# Patient Record
Sex: Male | Born: 1990 | Race: Black or African American | Hispanic: No | Marital: Single | Smoking: Never smoker
Health system: Southern US, Community
[De-identification: ages and names within clinical notes are randomized; demographics above are authoritative.]

---
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
## 2014-07-23 HISTORY — PX: SHOULDER SURGERY: SHX246

## 2016-09-25 ENCOUNTER — Ambulatory Visit (INDEPENDENT_AMBULATORY_CARE_PROVIDER_SITE_OTHER): Admitting: Family Medicine

## 2016-09-25 ENCOUNTER — Encounter: Payer: Self-pay | Admitting: Family Medicine

## 2016-09-25 DIAGNOSIS — M629 Disorder of muscle, unspecified: Secondary | ICD-10-CM

## 2016-09-25 DIAGNOSIS — F419 Anxiety disorder, unspecified: Secondary | ICD-10-CM | POA: Diagnosis not present

## 2016-09-25 NOTE — Assessment & Plan Note (Signed)
Suspect symptoms are related to tightness and stiffness of the muscles in his legs. Suspect this is related to him sitting for prolonged periods of time while at school. Discussed stretches for his hamstrings and his hip flexors. He'll continue to monitor.

## 2016-09-25 NOTE — Progress Notes (Signed)
[name], MD Phone: [phone]  Oscar Walker is a 26 y.o. male who presents today for a new patient visit.  Patient reports a fair amount of stress with school. He is currently a Holiday representative at [employer]. He notes during busy weeks with tests and working on projects he will feel very stressed and anxious. He will occasionally get a left-sided chest discomfort with this when he is studying or working on his projects. Only occurs when he is sitting and working on these things. No exertional component. No shortness of breath. Describes it as a throbbing discomfort. Lasts for about 20 minutes and then goes away on its own. No radiation. No diaphoresis. No history of hypertension, hyperlipidemia, or diabetes. No family history of heart disease. He also notes bitemporal headaches with this. Occurs with stress. He notes no numbness, weakness, or vision changes. These things have been going on intermittently over the last 2 years. He has never had symptoms with exercise. He has started to see a counselor and the symptoms have improved somewhat with this.  Patient additionally notes that his hamstrings might be tight and they pop a little bit when he's been sitting for a long period of time. He does note injuries in the past to his hamstrings with strains. He notes no recent injuries. He does not do any stretches for his hamstrings or hips.  Active Ambulatory Problems    Diagnosis Date Noted  . Anxiety 09/25/2016  . Hamstring tightness of both lower extremities 09/25/2016   Resolved Ambulatory Problems    Diagnosis Date Noted  . No Resolved Ambulatory Problems   No Additional Past Medical History    History reviewed. No pertinent family history.  Social History   Social History  . Marital status: Single    Spouse name: N/A  . Number of children: N/A  . Years of education: N/A   Occupational History  . Not on file.   Social History Main Topics  . Smoking status: Never Smoker    . Smokeless tobacco: Never Used  . Alcohol use No  . Drug use: No  . Sexual activity: Not on file   Other Topics Concern  . Not on file   Social History Narrative   Patient is a Holiday representative at [employer]. He is studying exercise science and hopes to go to physical therapy school in the future.    ROS  General:  Negative for nexplained weight loss, fever Skin: Negative for new or changing mole, sore that won't heal HEENT: Negative for trouble hearing, trouble seeing, ringing in ears, mouth sores, hoarseness, change in voice, dysphagia. CV:  Positive for chest pain, negative for  dyspnea, edema, palpitations Resp: Negative for cough, dyspnea, hemoptysis GI: Negative for nausea, vomiting, diarrhea, constipation, abdominal pain, melena, hematochezia. GU: Negative for dysuria, incontinence, urinary hesitance, hematuria, vaginal or penile discharge, polyuria, sexual difficulty, lumps in testicle or breasts MSK: Negative for muscle cramps or aches, joint pain or swelling Neuro: Positive for headaches, negative for  weakness, numbness, dizziness, passing out/fainting Psych: Negative for depression, anxiety, memory problems  Objective  Physical Exam Vitals:   09/25/16 0910  BP: 100/80  Pulse: 77  Temp: 98.6 F (37 C)    BP Readings from Last 3 Encounters:  09/25/16 100/80   Wt Readings from Last 3 Encounters:  09/25/16 194 lb 9.6 oz (88.3 kg)    Physical Exam  Constitutional: No distress.  HENT:  Head: Normocephalic and atraumatic.  Mouth/Throat: Oropharynx is clear  and moist.  Eyes: Conjunctivae are normal. Pupils are equal, round, and reactive to light.  Neck: Neck supple.  Cardiovascular: Normal rate, regular rhythm and normal heart sounds.   Pulmonary/Chest: Effort normal and breath sounds normal.  Abdominal: Soft. Bowel sounds are normal. He exhibits no distension. There is no tenderness. There is no rebound and no guarding.  Musculoskeletal: He  exhibits no edema.  Bilateral hamstrings nontender, full range of motion bilateral hips, full range of motion bilateral knees  Lymphadenopathy:    He has no cervical adenopathy.  Neurological: He is alert. Gait normal.  CN 2-12 intact, 5/5 strength in bilateral biceps, triceps, grip, quads, hamstrings, plantar and dorsiflexion, sensation to light touch intact in bilateral UE and LE  Skin: Skin is warm and dry. He is not diaphoretic.  Psychiatric:  Mood and affect normal at this time     Assessment/Plan:   Anxiety I suspect the patient's symptoms of chest pain and headaches are related to anxiety. Chest pain is very atypical and only occurs at rest when he is studying or working on a project. Headaches only occur at the same time and seem to be tension headaches. He has no symptoms when he has no anxiety. He is young and has no risk factors for cardiac disease. His vital signs are stable making VTE unlikely. No neurological deficits. No depression. No exercise issues. Discussed options for treatment of anxiety including SSRIs and potential risks of these versus counseling. He has been seeing a Veterinary surgeoncounselor and opted to continue to see them as this has been beneficial. He will continue to monitor. If he has worsening of symptoms he'll let us know. He is given return precautions.  Hamstring tightness of both lower extremities Suspect symptoms are related to tightness and stiffness of the muscles in his legs. Suspect this is related to him sitting for prolonged periods of time while at school. Discussed stretches for his hamstrings and his hip flexors. He'll continue to monitor.   No orders of the defined types were placed in this encounter.   No orders of the defined types were placed in this encounter.    Marikay AlarEric Sonnenberg, MD [facility] [place]

## 2016-09-25 NOTE — Progress Notes (Signed)
Pre visit review using our clinic review tool, if applicable. No additional management support is needed unless otherwise documented below in the visit note. 

## 2016-09-25 NOTE — Patient Instructions (Signed)
Nice to meet you. I suspect most of your symptoms are related to stress and anxiety. Please continue to see the counselor at school. If you would like to start on a medication for this in the future please let us know. Please do the stretches we discussed for your hamstrings and hips. If you develop change in your chest discomfort, shortness of breath, radiation, sweating, worsening or change in your headaches, numbness, weakness, vision changes, worsening of anxiety, or depression, or any new or changing symptoms please seek medical attention medially.

## 2016-09-25 NOTE — Assessment & Plan Note (Signed)
I suspect the patient's symptoms of chest pain and headaches are related to anxiety. Chest pain is very atypical and only occurs at rest when he is studying or working on a project. Headaches only occur at the same time and seem to be tension headaches. He has no symptoms when he has no anxiety. He is young and has no risk factors for cardiac disease. His vital signs are stable making VTE unlikely. No neurological deficits. No depression. No exercise issues. Discussed options for treatment of anxiety including SSRIs and potential risks of these versus counseling. He has been seeing a Veterinary surgeoncounselor and opted to continue to see them as this has been beneficial. He will continue to monitor. If he has worsening of symptoms he'll let us know. He is given return precautions.

## 2016-12-27 ENCOUNTER — Ambulatory Visit: Admitting: Family Medicine

## 2017-01-30 ENCOUNTER — Ambulatory Visit (INDEPENDENT_AMBULATORY_CARE_PROVIDER_SITE_OTHER): Admitting: Family Medicine

## 2017-01-30 ENCOUNTER — Encounter: Payer: Self-pay | Admitting: Family Medicine

## 2017-01-30 DIAGNOSIS — F419 Anxiety disorder, unspecified: Secondary | ICD-10-CM

## 2017-01-30 NOTE — Assessment & Plan Note (Signed)
Improved quite a bit. No more headaches. Occasional chest pain now that he is awaiting a decision from [place] on the postbaccalaureate program. No exertional symptoms. Unlikely cardiac, VTE, or GI related. Could be musculoskeletal though I do believe it's related to anxiety. He would like to try talking to his pastor to see if that would be beneficial. Offered referral to a therapist or medication though he declined currently. He will contact us if he would like to consider either of these. Given return precautions.

## 2017-01-30 NOTE — Progress Notes (Signed)
  Marikay AlarEric Dannon Nguyenthi, MD Phone: [phone]  Oscar PaddyJoshua M Brostrom is a 26 y.o. male who presents today for follow-up.  Anxiety: Patient notes this did get quite a bit better. Seeing a counselor was helpful. Also finishing school helped. Does still note occasional chest pain that only occurs when he has downtime as time to think of things. Has only returned since he is anxious to find out if he got into a postbaccalaureate program. Notes it is aching and he can pinpoint the area that it occurs. He has it at no other times. No pain with working out. No shortness of breath or radiation. Occasionally his chest will pop if he stretches. This does help with the discomfort. He's not had any headaches.   ROS see history of present illness  Objective  Physical Exam Vitals:   01/30/17 1406  BP: 112/70  Pulse: 73  Temp: 99.5 F (37.5 C)    BP Readings from Last 3 Encounters:  01/30/17 112/70  09/25/16 100/80   Wt Readings from Last 3 Encounters:  01/30/17 200 lb 6 oz (90.9 kg)  09/25/16 194 lb 9.6 oz (88.3 kg)    Physical Exam  Constitutional: No distress.  Cardiovascular: Normal rate, regular rhythm and normal heart sounds.   Pulmonary/Chest: Effort normal and breath sounds normal. He exhibits no tenderness.  Musculoskeletal: He exhibits no edema.  Neurological: He is alert. Gait normal.  Skin: He is not diaphoretic.     Assessment/Plan: Please see individual problem list.  Anxiety Improved quite a bit. No more headaches. Occasional chest pain now that he is awaiting a decision from [place] on the postbaccalaureate program. No exertional symptoms. Unlikely cardiac, VTE, or GI related. Could be musculoskeletal though I do believe it's related to anxiety. He would like to try talking to his pastor to see if that would be beneficial. Offered referral to a therapist or medication though he declined currently. He will contact us if he would like to consider either of these. Given return  precautions.  Marikay AlarEric Jacai Kipp, MD [facility] [place]

## 2017-01-30 NOTE — Patient Instructions (Signed)
Nice to see you. Please continue to monitor your anxiety. Please try to talk to your pastor. If you would like a referral to a counselor or therapist please let us know. If you're chest pain changes or you develop any new symptoms with the please be reevaluated.

## 2019-02-06 ENCOUNTER — Other Ambulatory Visit: Payer: Self-pay

## 2019-02-11 ENCOUNTER — Telehealth: Payer: Self-pay | Admitting: Family Medicine

## 2019-02-11 NOTE — Telephone Encounter (Signed)
[place] requiring patient to have QuantiFERON-TB Gold Plus orders please fax to [facility]  [address] [place], [place] [address], [phone]. Patient would like a follow up when orders are faxed

## 2019-02-12 NOTE — Telephone Encounter (Signed)
It looks like he is coming in for a physical next week. We can obtain this test at that time if he is willing to wait until then, otherwise I can place orders for that and his other labs. Please find out if he has ever had a positive PPD. Thanks.

## 2019-02-12 NOTE — Telephone Encounter (Signed)
Lmtcb.  Tiphanie Vo,cma 

## 2019-02-13 NOTE — Telephone Encounter (Signed)
Noted  

## 2019-02-13 NOTE — Telephone Encounter (Signed)
Patient returned my call and stated he can wait until his physical to do the test and other lab work. Patient states he has never tested positive this test is for school.  Reverie Vaquera,cma

## 2019-02-17 ENCOUNTER — Ambulatory Visit (INDEPENDENT_AMBULATORY_CARE_PROVIDER_SITE_OTHER): Payer: No Typology Code available for payment source | Admitting: Family Medicine

## 2019-02-17 ENCOUNTER — Other Ambulatory Visit: Payer: Self-pay

## 2019-02-17 ENCOUNTER — Encounter (INDEPENDENT_AMBULATORY_CARE_PROVIDER_SITE_OTHER): Payer: Self-pay

## 2019-02-17 ENCOUNTER — Encounter: Payer: Self-pay | Admitting: Family Medicine

## 2019-02-17 VITALS — BP 118/80 | HR 57 | Temp 97.8°F | Ht 66.5 in | Wt 198.4 lb

## 2019-02-17 DIAGNOSIS — Z0000 Encounter for general adult medical examination without abnormal findings: Secondary | ICD-10-CM | POA: Diagnosis not present

## 2019-02-17 DIAGNOSIS — Z111 Encounter for screening for respiratory tuberculosis: Secondary | ICD-10-CM

## 2019-02-17 DIAGNOSIS — Z13220 Encounter for screening for lipoid disorders: Secondary | ICD-10-CM | POA: Diagnosis not present

## 2019-02-17 DIAGNOSIS — Z1329 Encounter for screening for other suspected endocrine disorder: Secondary | ICD-10-CM | POA: Diagnosis not present

## 2019-02-17 DIAGNOSIS — E669 Obesity, unspecified: Secondary | ICD-10-CM | POA: Diagnosis not present

## 2019-02-17 DIAGNOSIS — Z130 Encounter for screening for diseases of the blood and blood-forming organs and certain disorders involving the immune mechanism: Secondary | ICD-10-CM

## 2019-02-17 DIAGNOSIS — E66811 Obesity, class 1: Secondary | ICD-10-CM

## 2019-02-17 DIAGNOSIS — Z789 Other specified health status: Secondary | ICD-10-CM

## 2019-02-17 LAB — LIPID PANEL
Cholesterol: 231 mg/dL — ABNORMAL HIGH (ref 0–200)
HDL: 39.2 mg/dL (ref >39.00)
LDL Cholesterol: 165 mg/dL — ABNORMAL HIGH (ref 0–99)
NonHDL: 192.08
Total CHOL/HDL Ratio: 6
Triglycerides: 136 mg/dL (ref 0.0–149.0)
VLDL: 27.2 mg/dL (ref 0.0–40.0)

## 2019-02-17 LAB — COMPREHENSIVE METABOLIC PANEL
ALT: 15 U/L (ref 0–53)
AST: 16 U/L (ref 0–37)
Albumin: 5.1 g/dL (ref 3.5–5.2)
Alkaline Phosphatase: 39 U/L (ref 39–117)
BUN: 14 mg/dL (ref 6–23)
CO2: 25 mEq/L (ref 19–32)
Calcium: 9.7 mg/dL (ref 8.4–10.5)
Chloride: 104 mEq/L (ref 96–112)
Creatinine, Ser: 1.12 mg/dL (ref 0.40–1.50)
GFR: 95.97 mL/min (ref >60.00)
Glucose, Bld: 87 mg/dL (ref 70–99)
Potassium: 3.8 mEq/L (ref 3.5–5.1)
Sodium: 138 mEq/L (ref 135–145)
Total Bilirubin: 0.4 mg/dL (ref 0.2–1.2)
Total Protein: 7.7 g/dL (ref 6.0–8.3)

## 2019-02-17 LAB — CBC
HCT: 41.6 % (ref 39.0–52.0)
Hemoglobin: 14 g/dL (ref 13.0–17.0)
MCHC: 33.7 g/dL (ref 30.0–36.0)
MCV: 87.2 fl (ref 78.0–100.0)
Platelets: 278 10*3/uL (ref 150.0–400.0)
RBC: 4.77 Mil/uL (ref 4.22–5.81)
RDW: 13.5 % (ref 11.5–15.5)
WBC: 4.9 10*3/uL (ref 4.0–10.5)

## 2019-02-17 LAB — HEMOGLOBIN A1C: Hgb A1c MFr Bld: 5.8 % (ref 4.6–6.5)

## 2019-02-17 LAB — TSH: TSH: 1.27 u[IU]/mL (ref 0.35–4.50)

## 2019-02-17 NOTE — Progress Notes (Signed)
Tommi Rumps, MD Phone: [phone]  Oscar Walker is a 28 y.o. male who presents today for CPE.  Diet: Eats a variety of foods.  He is eating more vegetables recently.  Not many fruits.  He has cut down on carbs and ice cream intake. Exercise: Not as much lately as the gyms have not been open.  He was going to prior to the COVID-19 pandemic. No prior HIV screening.  He declines HIV screening. Tetanus vaccine 12/29/2010. No family history of colon cancer though his father had a few polyps.  No family history of prostate cancer. Not sexually active. No tobacco use, alcohol use, or illicit drug use. He has deferred seeing a dentist as he does not have dental insurance currently. He does not see an ophthalmologist. He is applying to the [facility] radiology program and has paperwork he needs filled out. Reports stress from trying to get all of the paperwork completed though no depression or anxiety.  Active Ambulatory Problems    Diagnosis Date Noted  . Anxiety 09/25/2016  . Hamstring tightness of both lower extremities 09/25/2016  . Routine general medical examination at a health care facility 02/22/2019   Resolved Ambulatory Problems    Diagnosis Date Noted  . No Resolved Ambulatory Problems   No Additional Past Medical History    No family history on file.  Social History   Socioeconomic History  . Marital status: Single    Spouse name: Not on file  . Number of children: Not on file  . Years of education: Not on file  . Highest education level: Not on file  Occupational History  . Not on file  Social Needs  . Financial resource strain: Not on file  . Food insecurity    Worry: Not on file    Inability: Not on file  . Transportation needs    Medical: Not on file    Non-medical: Not on file  Tobacco Use  . Smoking status: Never Smoker  . Smokeless tobacco: Never Used  Substance and Sexual Activity  . Alcohol use: No  . Drug use: No  . Sexual activity: Not on file   Lifestyle  . Physical activity    Days per week: Not on file    Minutes per session: Not on file  . Stress: Not on file  Relationships  . Social Herbalist on phone: Not on file    Gets together: Not on file    Attends religious service: Not on file    Active member of club or organization: Not on file    Attends meetings of clubs or organizations: Not on file    Relationship status: Not on file  . Intimate partner violence    Fear of current or ex partner: Not on file    Emotionally abused: Not on file    Physically abused: Not on file    Forced sexual activity: Not on file  Other Topics Concern  . 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    ROS  General:  Negative for nexplained weight loss, fever Skin: Negative for new or changing mole, sore that won't heal HEENT: Negative for trouble hearing, trouble seeing, ringing in ears, mouth sores, hoarseness, change in voice, dysphagia. CV:  Negative for chest pain, dyspnea, edema, palpitations Resp: Negative for cough, dyspnea, hemoptysis  GI: Negative for nausea, vomiting, diarrhea, constipation, abdominal pain, melena, hematochezia. GU: Negative for dysuria, incontinence, urinary hesitance, hematuria, vaginal or penile discharge, polyuria, sexual difficulty, lumps in testicle or breasts MSK: Negative for muscle cramps or aches, joint pain or swelling Neuro: Negative for headaches, weakness, numbness, dizziness, passing out/fainting Psych: Negative for depression, anxiety, memory problems  Objective  Physical Exam Vitals:   02/17/19 0833  BP: 118/80  Pulse: (!) 57  Temp: 97.8 F (36.6 C)  SpO2: 98%    BP Readings from Last 3 Encounters:  02/17/19 118/80  01/30/17 112/70  09/25/16 100/80   Wt Readings from Last 3 Encounters:  02/17/19 198 lb 6.4 oz (90 kg)  01/30/17  200 lb 6 oz (90.9 kg)  09/25/16 194 lb 9.6 oz (88.3 kg)    Physical Exam Constitutional:      General: He is not in acute distress.    Appearance: He is not diaphoretic.  HENT:     Head: Normocephalic and atraumatic.  Eyes:     Conjunctiva/sclera: Conjunctivae normal.     Pupils: Pupils are equal, round, and reactive to light.  Cardiovascular:     Rate and Rhythm: Normal rate and regular rhythm.     Heart sounds: Normal heart sounds.  Pulmonary:     Effort: Pulmonary effort is normal.     Breath sounds: Normal breath sounds.  Abdominal:     General: Bowel sounds are normal. There is no distension.     Tenderness: There is no abdominal tenderness. There is no guarding or rebound.  Musculoskeletal:     Right lower leg: No edema.     Left lower leg: No edema.  Lymphadenopathy:     Cervical: No cervical adenopathy.  Skin:    General: Skin is warm and dry.  Neurological:     Mental Status: He is alert.  Psychiatric:        Mood and Affect: Mood normal.      Assessment/Plan:   Routine general medical examination at a health care facility Physical exam completed.  Encouraged continued healthy diet and adding in exercise.  He declines HIV screening.  Tetanus vaccine up-to-date.  Discussed HPV vaccine and the reasoning behind getting this though he declines this currently to check with insurance regarding coverage.  Check lab work as outlined below.   Orders Placed This Encounter  Procedures  . Comp Met (CMET)  . CBC  . Lipid panel  . TSH  . HgB A1c  . QuantiFERON-TB Gold Plus  . Hepatitis B surface antibody,qualitative    No orders of the defined types were placed in this encounter.    Tommi Rumps, MD [place]

## 2019-02-17 NOTE — Patient Instructions (Signed)
Nice to see you. Please add in some exercise and work on your diet as we discussed. I have included some diet information below.  We will contact you with your lab results.   Diet Recommendations  Starchy (carb) foods: Bread, rice, pasta, potatoes, corn, cereal, grits, crackers, bagels, muffins, all baked goods.  (Fruits, milk, and yogurt also have carbohydrate, but most of these foods will not spike your blood sugar as the starchy foods will.)  A few fruits do cause high blood sugars; use small portions of bananas (limit to 1/2 at a time), grapes, watermelon, oranges, and most tropical fruits.    Protein foods: Meat, fish, poultry, eggs, dairy foods, and beans such as pinto and kidney beans (beans also provide carbohydrate).   1. Eat at least 3 meals and 1-2 snacks per day. Never go more than 4-5 hours while awake without eating. Eat breakfast within the first hour of getting up.   2. Limit starchy foods to TWO per meal and ONE per snack. ONE portion of a starchy  food is equal to the following:   - ONE slice of bread (or its equivalent, such as half of a hamburger bun).   - 1/2 cup of a "scoopable" starchy food such as potatoes or rice.   - 15 grams of carbohydrate as shown on food label.  3. Include at every meal: a protein food, a carb food, and vegetables and/or fruit.   - Obtain twice the volume of veg's as protein or carbohydrate foods for both lunch and dinner.   - Fresh or frozen veg's are best.   - Keep frozen veg's on hand for a quick vegetable serving.

## 2019-02-18 LAB — HEPATITIS B SURFACE ANTIBODY,QUALITATIVE: Hep B S Ab: NONREACTIVE

## 2019-02-18 LAB — QUANTIFERON-TB GOLD PLUS

## 2019-02-19 ENCOUNTER — Encounter: Payer: Self-pay | Admitting: Family Medicine

## 2019-02-19 ENCOUNTER — Telehealth: Payer: Self-pay | Admitting: Family Medicine

## 2019-02-19 ENCOUNTER — Telehealth: Payer: Self-pay

## 2019-02-19 DIAGNOSIS — Z111 Encounter for screening for respiratory tuberculosis: Secondary | ICD-10-CM

## 2019-02-19 NOTE — Telephone Encounter (Signed)
Copied from [place] [id number]. Topic: Appointment Scheduling - Scheduling Inquiry for Clinic >> Feb 19, 2019  3:32 PM Rainey Pines A wrote: Patient called back to schedule Quantiferon test that needs to be redone.

## 2019-02-19 NOTE — Telephone Encounter (Signed)
Called patient to schedule labs, lm on voicemail to call back.  Stormy Connon,cma

## 2019-02-20 ENCOUNTER — Telehealth: Payer: Self-pay | Admitting: Family Medicine

## 2019-02-20 NOTE — Telephone Encounter (Signed)
PT called to set up a HEP B inj. There are no open nurse visits. Please call pt and set up a time.

## 2019-02-20 NOTE — Telephone Encounter (Signed)
Santiago Glad I called him back but he is at work, if he calls back schedule him a nurse visit and it does not have to be urgent the first available is fine.  Thanks  Brendi Mccarroll,cma

## 2019-02-22 DIAGNOSIS — Z0001 Encounter for general adult medical examination with abnormal findings: Secondary | ICD-10-CM | POA: Insufficient documentation

## 2019-02-22 DIAGNOSIS — Z0000 Encounter for general adult medical examination without abnormal findings: Secondary | ICD-10-CM | POA: Insufficient documentation

## 2019-02-22 NOTE — Assessment & Plan Note (Signed)
Physical exam completed.  Encouraged continued healthy diet and adding in exercise.  He declines HIV screening.  Tetanus vaccine up-to-date.  Discussed HPV vaccine and the reasoning behind getting this though he declines this currently to check with insurance regarding coverage.  Check lab work as outlined below.

## 2019-02-23 NOTE — Telephone Encounter (Signed)
Pt needs to have vaccinations done sooner than 03/04/2019 and would like to know if he needs all 3 Hep B shots or if he just needs a booster. Please call pt to advise: [phone]

## 2019-02-24 NOTE — Telephone Encounter (Signed)
  Pt needs to have vaccinations done sooner than 03/04/2019 and would like to know if he needs all 3 Hep B shots or if he just needs a booster.

## 2019-02-24 NOTE — Telephone Encounter (Signed)
He will need to complete the entire series again. He should check with the school to confirm whether or not he needs all 3 completed prior to starting school or if he can start the series and then start school while he is completing the series. Thanks.

## 2019-02-24 NOTE — Telephone Encounter (Signed)
error 

## 2019-02-25 ENCOUNTER — Encounter: Payer: Self-pay | Admitting: Family Medicine

## 2019-02-25 LAB — QUANTIFERON-TB GOLD PLUS
QuantiFERON Mitogen Value: >10 IU/mL
QuantiFERON Nil Value: 0.01 IU/mL
QuantiFERON TB1 Ag Value: 0.01 IU/mL
QuantiFERON TB2 Ag Value: 0.01 IU/mL
QuantiFERON-TB Gold Plus: NEGATIVE

## 2019-02-27 NOTE — Telephone Encounter (Signed)
I called and spoke with the patient, he started the hep b vaccine series at his local pharmacy and I scheduled his next vaccine dose in 30 days from 02/24/2019 when he received the first one.  Khylie Larmore,cma

## 2019-03-04 ENCOUNTER — Ambulatory Visit: Payer: No Typology Code available for payment source

## 2019-03-26 ENCOUNTER — Other Ambulatory Visit: Payer: Self-pay

## 2019-03-26 ENCOUNTER — Ambulatory Visit (INDEPENDENT_AMBULATORY_CARE_PROVIDER_SITE_OTHER): Payer: No Typology Code available for payment source

## 2019-03-26 DIAGNOSIS — Z23 Encounter for immunization: Secondary | ICD-10-CM

## 2019-04-09 ENCOUNTER — Other Ambulatory Visit: Payer: Self-pay

## 2019-04-09 ENCOUNTER — Ambulatory Visit (INDEPENDENT_AMBULATORY_CARE_PROVIDER_SITE_OTHER): Payer: No Typology Code available for payment source

## 2019-04-09 DIAGNOSIS — Z23 Encounter for immunization: Secondary | ICD-10-CM | POA: Diagnosis not present

## 2019-04-09 NOTE — Progress Notes (Signed)
Patient presented today for 2nd dose of Hep B vaccine.  Patient said that he got his first dosage at [facility].  Patient presented documentation from [facility] of his first dose of Engerix-B on 02/24/19.    Consulted with Philis Nettle, FNP regarding 2nd and 3rd dosages since Dr. Caryl Bis is out-of-the-office today.  Guse, FNP ok for pt to receive 2nd and 3rd dosage at this clinic.  Patient aware that he needs to return 6 months after his initial dosage which will be around August 26, 2018.  Vaccination information from pharmacy updated in pt's chart.  Copy made of record from pharmacy and sent to scan.

## 2019-11-14 ENCOUNTER — Ambulatory Visit: Payer: No Typology Code available for payment source

## 2019-11-14 ENCOUNTER — Ambulatory Visit: Payer: No Typology Code available for payment source | Attending: Internal Medicine

## 2019-11-14 DIAGNOSIS — Z23 Encounter for immunization: Secondary | ICD-10-CM

## 2019-11-14 NOTE — Progress Notes (Signed)
   Covid-19 Vaccination Clinic  Name:  [name]    MRN: [id number] DOB: 01-02-1991  11/14/2019  [name] was observed post Covid-19 immunization for 15 minutes without incident. He was provided with Vaccine Information Sheet and instruction to access the V-Safe system.   [name] was instructed to call 911 with any severe reactions post vaccine: Marland Kitchen Difficulty breathing  . Swelling of face and throat  . A fast heartbeat  . A bad rash all over body  . Dizziness and weakness   Immunizations Administered    Name Date Dose VIS Date Route   Pfizer COVID-19 Vaccine 11/14/2019 11:30 AM 0.3 mL 09/16/2018 Intramuscular   Manufacturer: [employer], [employer]   Lot: [id number]   NDC: [address]-9

## 2019-12-08 ENCOUNTER — Ambulatory Visit: Payer: No Typology Code available for payment source | Attending: Internal Medicine

## 2019-12-08 DIAGNOSIS — Z23 Encounter for immunization: Secondary | ICD-10-CM

## 2019-12-08 NOTE — Progress Notes (Signed)
   Covid-19 Vaccination Clinic  Name:  JAIZON DEROOS    MRN: [id number] DOB: Jul 01, 1991  12/08/2019  Mr. Tierno was observed post Covid-19 immunization for 15 minutes without incident. He was provided with Vaccine Information Sheet and instruction to access the V-Safe system.   Mr. Vandevelde was instructed to call 911 with any severe reactions post vaccine: Marland Kitchen Difficulty breathing  . Swelling of face and throat  . A fast heartbeat  . A bad rash all over body  . Dizziness and weakness   Immunizations Administered    Name Date Dose VIS Date Route   Pfizer COVID-19 Vaccine 12/08/2019  9:04 AM 0.3 mL 09/16/2018 Intramuscular   Manufacturer: [employer], [employer]   Lot: [id number]   NDC: [address]-1

## 2020-02-15 ENCOUNTER — Encounter: Payer: Self-pay | Admitting: Family Medicine

## 2020-02-15 DIAGNOSIS — Z111 Encounter for screening for respiratory tuberculosis: Secondary | ICD-10-CM

## 2020-02-19 ENCOUNTER — Ambulatory Visit (INDEPENDENT_AMBULATORY_CARE_PROVIDER_SITE_OTHER): Payer: No Typology Code available for payment source | Admitting: Family Medicine

## 2020-02-19 ENCOUNTER — Telehealth: Payer: Self-pay | Admitting: Family Medicine

## 2020-02-19 ENCOUNTER — Other Ambulatory Visit: Payer: Self-pay

## 2020-02-19 ENCOUNTER — Encounter: Payer: Self-pay | Admitting: Family Medicine

## 2020-02-19 VITALS — BP 120/80 | HR 71 | Temp 97.5°F | Ht 66.5 in | Wt 200.2 lb

## 2020-02-19 DIAGNOSIS — Z0001 Encounter for general adult medical examination with abnormal findings: Secondary | ICD-10-CM

## 2020-02-19 DIAGNOSIS — R0989 Other specified symptoms and signs involving the circulatory and respiratory systems: Secondary | ICD-10-CM

## 2020-02-19 DIAGNOSIS — R21 Rash and other nonspecific skin eruption: Secondary | ICD-10-CM | POA: Diagnosis not present

## 2020-02-19 DIAGNOSIS — R7303 Prediabetes: Secondary | ICD-10-CM | POA: Diagnosis not present

## 2020-02-19 LAB — POCT GLYCOSYLATED HEMOGLOBIN (HGB A1C): Hemoglobin A1C: 5.3 % (ref 4.0–5.6)

## 2020-02-19 MED ORDER — TRIAMCINOLONE ACETONIDE 0.1 % EX CREA: 1.0000 "application " | TOPICAL_CREAM | Freq: Two times a day (BID) | CUTANEOUS | 0 refills | Status: DC

## 2020-02-19 NOTE — Progress Notes (Signed)
Oscar Alar, MD Phone: [phone]  Oscar Walker is a 29 y.o. male who presents today for CPE.  Diet: needs to increase fruits and vegetables, eats most things Exercise: started back with body weight exercises 2x/week Family history-  Prostate cancer: no  Colon cancer: no Sexually active: no Vaccines-   Flu: out of season  Tetanus: UTD  COVID19: UTD HIV screening: declines Hep C Screening: declines Tobacco use: no Alcohol use: no Illicit Drug use: no Dentist: due Ophthalmology: no  Supraclavicular lymph node: Patient reports noting this about 2 weeks after getting his second Covid vaccine back in May.  There is been no pain associated with this.  He has had no weight loss or night sweats.  Has had no other symptoms.  He did have a fever and brief aching with the Covid vaccine though that resolved within a few hours.  Rash: Did note a little bit of a rash on his neck bilaterally.  He was using a [employer] psoriasis cream for this with decent benefit.  He does have a history of eczema.   Active Ambulatory Problems    Diagnosis Date Noted  . Anxiety 09/25/2016  . Hamstring tightness of both lower extremities 09/25/2016  . Encounter for general adult medical examination with abnormal findings 02/22/2019  . Lymph node symptom 02/19/2020  . Rash 02/19/2020   Resolved Ambulatory Problems    Diagnosis Date Noted  . No Resolved Ambulatory Problems   No Additional Past Medical History    No family history on file.  Social History   Socioeconomic History  . Marital status: Single    Spouse name: Not on file  . Number of children: Not on file  . Years of education: Not on file  . Highest education level: Not on file  Occupational History  . Not on file  Tobacco Use  . Smoking status: Never Smoker  . Smokeless tobacco: Never Used  Substance and Sexual Activity  . Alcohol use: No  . Drug use: No  . Sexual activity: Not on file  Other Topics Concern  . 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   Social Determinants of Health   Financial Resource Strain:   . Difficulty of Paying Living Expenses:   Food Insecurity:   . Worried About Programme researcher, broadcasting/film/video in the Last Year:   . Barista in the Last Year:   Transportation Needs:   . Freight forwarder (Medical):   Marland Kitchen Lack of Transportation (Non-Medical):   Physical Activity:   . Days of Exercise per Week:   . Minutes of Exercise per Session:   Stress:   . Feeling of Stress :   Social Connections:   . Frequency of Communication with Friends and Family:   . Frequency of Social Gatherings with Friends and Family:   . Attends Religious Services:   . Active Member of Clubs or Organizations:   . Attends Banker Meetings:   Marland Kitchen Marital Status:   Intimate Partner Violence:   . Fear of Current or Ex-Partner:   . Emotionally Abused:   Marland Kitchen Physically Abused:   . Sexually Abused:     ROS  General:  Negative for nexplained weight loss, fever Skin: Negative for new or changing mole, sore that won't heal HEENT: Negative for trouble hearing, trouble seeing,  ringing in ears, mouth sores, hoarseness, change in voice, dysphagia. CV:  Negative for chest pain, dyspnea, edema, palpitations Resp: Negative for cough, dyspnea, hemoptysis GI: Negative for nausea, vomiting, diarrhea, constipation, abdominal pain, melena, hematochezia. GU: Negative for dysuria, incontinence, urinary hesitance, hematuria, vaginal or penile discharge, polyuria, sexual difficulty, lumps in testicle or breasts MSK: Negative for muscle cramps or aches, joint pain or swelling Neuro: Negative for headaches, weakness, numbness, dizziness, passing out/fainting Psych: Negative for depression, anxiety, memory problems  Objective  Physical Exam Vitals:   02/19/20 0833  BP:  120/80  Pulse: 71  Temp: (!) 97.5 F (36.4 C)  SpO2: 99%    BP Readings from Last 3 Encounters:  02/19/20 120/80  02/17/19 118/80  01/30/17 112/70   Wt Readings from Last 3 Encounters:  02/19/20 200 lb 3.2 oz (90.8 kg)  02/17/19 198 lb 6.4 oz (90 kg)  01/30/17 200 lb 6 oz (90.9 kg)    Physical Exam Constitutional:      General: He is not in acute distress.    Appearance: He is not diaphoretic.  HENT:     Head: Normocephalic and atraumatic.  Eyes:     Conjunctiva/sclera: Conjunctivae normal.     Pupils: Pupils are equal, round, and reactive to light.  Cardiovascular:     Rate and Rhythm: Normal rate and regular rhythm.     Heart sounds: Normal heart sounds.  Pulmonary:     Effort: Pulmonary effort is normal.     Breath sounds: Normal breath sounds.  Abdominal:     General: Bowel sounds are normal. There is no distension.     Palpations: Abdomen is soft.     Tenderness: There is no abdominal tenderness. There is no guarding or rebound.  Musculoskeletal:     Right lower leg: No edema.     Left lower leg: No edema.  Lymphadenopathy:     Head:     Right side of head: No submental or submandibular adenopathy.     Left side of head: No submental or submandibular adenopathy.     Cervical: No cervical adenopathy.     Upper Body:     Right upper body: No supraclavicular or axillary adenopathy.     Left upper body: No axillary adenopathy.     Lower Body: No right inguinal adenopathy. No left inguinal adenopathy.     Comments: Small palpable lymph node overlying his left clavicle, this is freely mobile and nontender  Skin:    General: Skin is warm and dry.  Neurological:     Mental Status: He is alert.  Psychiatric:        Mood and Affect: Mood normal.      Assessment/Plan:   Encounter for general adult medical examination with abnormal findings Physical exam completed.  Encourage diet and exercise.  Patient declines hepatitis C and HIV screening.  Encouraged safe  sex practices when he does become sexually active.  Encouraged him to see a dentist as well.  Discussed lipid screening is recommended every 5 years.  Though he may do this more frequently.  A1c due today given prediabetes last year.  Lymph node symptom This lymph node seems to be less than 1 cm on exam and is freely mobile which is reassuring.  Discussed that this is likely related to his COVID-19 vaccine though we will get an ultrasound to evaluate this further.  I think an ultrasound is reasonable as a first step given the history and the fact that the  lymph node is very superficial on exam.  Rash Could be eczema related.  Less likely psoriasis.  We will give him triamcinolone to try in the future if needed.   Orders Placed This Encounter  Procedures  . [place] CHEST SOFT TISSUE    Standing Status:   Future    Standing Expiration Date:   02/18/2021    Order Specific Question:   Reason for Exam (SYMPTOM  OR DIAGNOSIS REQUIRED)    Answer:   lymph node noted overlying left clavicle, freely mobile, nontender, has been stable, present shortly after 2nd COVID19 vaccine in May    Order Specific Question:   Preferred imaging location?    Answer:   [facility]  . POCT HgB A1C    Meds ordered this encounter  Medications  . triamcinolone cream (KENALOG) 0.1 %    Sig: Apply 1 application topically 2 (two) times daily.    Dispense:  30 g    Refill:  0    This visit occurred during the SARS-CoV-2 public health emergency.  Safety protocols were in place, including screening questions prior to the visit, additional usage of staff PPE, and extensive cleaning of exam room while observing appropriate contact time as indicated for disinfecting solutions.    Oscar Alar, MD [facility]

## 2020-02-19 NOTE — Telephone Encounter (Signed)
lft vm for pt to call ofc to sch US 

## 2020-02-19 NOTE — Assessment & Plan Note (Signed)
Could be eczema related.  Less likely psoriasis.  We will give him triamcinolone to try in the future if needed.

## 2020-02-19 NOTE — Assessment & Plan Note (Signed)
This lymph node seems to be less than 1 cm on exam and is freely mobile which is reassuring.  Discussed that this is likely related to his COVID-19 vaccine though we will get an ultrasound to evaluate this further.  I think an ultrasound is reasonable as a first step given the history and the fact that the lymph node is very superficial on exam.

## 2020-02-19 NOTE — Patient Instructions (Signed)
Nice to see you. Please continue to work on diet and exercise. Somebody will call you to schedule the ultrasound. You can try triamcinolone the next time you have a rash and see if that helps.

## 2020-02-19 NOTE — Assessment & Plan Note (Signed)
Physical exam completed.  Encourage diet and exercise.  Patient declines hepatitis C and HIV screening.  Encouraged safe sex practices when he does become sexually active.  Encouraged him to see a dentist as well.  Discussed lipid screening is recommended every 5 years.  Though he may do this more frequently.  A1c due today given prediabetes last year.

## 2020-02-20 LAB — QUANTIFERON-TB GOLD PLUS
QuantiFERON Mitogen Value: >10 IU/mL
QuantiFERON Nil Value: 0.02 IU/mL
QuantiFERON TB1 Ag Value: 0.03 IU/mL
QuantiFERON TB2 Ag Value: 0.04 IU/mL
QuantiFERON-TB Gold Plus: NEGATIVE

## 2020-02-25 ENCOUNTER — Ambulatory Visit
Admission: RE | Admit: 2020-02-25 | Discharge: 2020-02-25 | Disposition: A | Discharge status: Home / Self Care | Payer: No Typology Code available for payment source | Source: Ambulatory Visit | Attending: Family Medicine | Admitting: Family Medicine

## 2020-02-25 ENCOUNTER — Other Ambulatory Visit: Payer: Self-pay

## 2020-02-25 DIAGNOSIS — R0989 Other specified symptoms and signs involving the circulatory and respiratory systems: Secondary | ICD-10-CM | POA: Diagnosis not present

## 2020-03-29 ENCOUNTER — Encounter: Payer: Self-pay | Admitting: Family Medicine

## 2020-04-04 ENCOUNTER — Encounter: Payer: Self-pay | Admitting: Family Medicine

## 2020-04-04 ENCOUNTER — Other Ambulatory Visit: Payer: Self-pay

## 2020-04-04 ENCOUNTER — Ambulatory Visit: Payer: 59 | Admitting: Family Medicine

## 2020-04-04 VITALS — BP 130/70 | HR 88 | Temp 98.1°F | Ht 66.5 in | Wt 196.8 lb

## 2020-04-04 DIAGNOSIS — Z23 Encounter for immunization: Secondary | ICD-10-CM | POA: Diagnosis not present

## 2020-04-04 DIAGNOSIS — R0989 Other specified symptoms and signs involving the circulatory and respiratory systems: Secondary | ICD-10-CM

## 2020-04-04 NOTE — Assessment & Plan Note (Addendum)
Unable to palpate any significant lymph nodes in the left supraclavicular area.  He will monitor for any recurrence or enlargement of the previously enlarged lymph node and he will let us know if this occurs.

## 2020-04-04 NOTE — Patient Instructions (Signed)
Nice to see you. If the lymph node enlarges again please let us know.

## 2020-04-04 NOTE — Progress Notes (Signed)
  [name], MD Phone: [phone]  Oscar Walker is a 29 y.o. male who presents today for f/u.  Patient presents for recheck of left supraclavicular lymph node.  He had an ultrasound revealing reactive lymph nodes in left supraclavicular region likely related to recent COVID-19 infection.  Clinical follow-up was recommended.  He feels as though the area has improved quite a bit.  Social History   Tobacco Use  Smoking Status Never Smoker  Smokeless Tobacco Never Used     ROS see history of present illness  Objective  Physical Exam Vitals:   04/04/20 1600  BP: 130/70  Pulse: 88  Temp: 98.1 F (36.7 C)  SpO2: 98%    BP Readings from Last 3 Encounters:  04/04/20 130/70  02/19/20 120/80  02/17/19 118/80   Wt Readings from Last 3 Encounters:  04/04/20 196 lb 12.8 oz (89.3 kg)  02/19/20 200 lb 3.2 oz (90.8 kg)  02/17/19 198 lb 6.4 oz (90 kg)    Physical Exam Lymphadenopathy:     Upper Body:     Left upper body: No supraclavicular adenopathy.   No palpable lymph nodes in the area of prior concern   Assessment/Plan: Please see individual problem list.  Lymph node symptom Unable to palpate any significant lymph nodes in the left supraclavicular area.  He will monitor for any recurrence or enlargement of the previously enlarged lymph node and he will let us know if this occurs.    Orders Placed This Encounter  Procedures  . Flu Vaccine QUAD 36+ mos IM    No orders of the defined types were placed in this encounter.   This visit occurred during the SARS-CoV-2 public health emergency.  Safety protocols were in place, including screening questions prior to the visit, additional usage of staff PPE, and extensive cleaning of exam room while observing appropriate contact time as indicated for disinfecting solutions.    [name], MD [facility]

## 2020-10-13 ENCOUNTER — Telehealth: Payer: Self-pay | Admitting: Family Medicine

## 2020-10-13 NOTE — Telephone Encounter (Signed)
Appointment has been scheduled with Cory,NP at [facility] tomorrow at 1500.

## 2020-10-13 NOTE — Telephone Encounter (Signed)
Patient called in stated that he has some covid symptoms cough, congestion  He  Has been tested it was negative wanted to know what to do

## 2020-10-14 ENCOUNTER — Telehealth (INDEPENDENT_AMBULATORY_CARE_PROVIDER_SITE_OTHER): Payer: 59 | Admitting: Adult Health

## 2020-10-14 ENCOUNTER — Other Ambulatory Visit: Payer: Self-pay | Admitting: Family Medicine

## 2020-10-14 DIAGNOSIS — J988 Other specified respiratory disorders: Secondary | ICD-10-CM

## 2020-10-14 DIAGNOSIS — B9789 Other viral agents as the cause of diseases classified elsewhere: Secondary | ICD-10-CM | POA: Diagnosis not present

## 2020-10-14 DIAGNOSIS — R21 Rash and other nonspecific skin eruption: Secondary | ICD-10-CM

## 2020-10-14 MED ORDER — TRIAMCINOLONE ACETONIDE 0.1 % EX CREA: 1.0000 "application " | TOPICAL_CREAM | Freq: Two times a day (BID) | CUTANEOUS | 0 refills | Status: DC

## 2020-10-14 MED ORDER — BENZONATATE 200 MG PO CAPS: 200.0000 mg | ORAL_CAPSULE | Freq: Three times a day (TID) | ORAL | 1 refills | Status: DC | PRN

## 2020-10-14 NOTE — Progress Notes (Signed)
Virtual Visit via Video Note  I connected with Oscar Walker on 10/14/20 at  3:00 PM EDT by a video enabled telemedicine application and verified that I am speaking with the correct person using two identifiers.  Location patient: home Location provider:work or home office Persons participating in the virtual visit: patient, provider  I discussed the limitations of evaluation and management by telemedicine and the availability of in person appointments. The patient expressed understanding and agreed to proceed.   HPI: 30 year old male who is being evaluated today for an acute issue.  Symptoms started 5 days ago with a cough.  The next day it progressed into cough, sore throat, body aches, fever up to 100.5, and fatigue.  As the rest of the week progressed his symptoms slowly started to improve.  Today he is left with a dry cough, some very mild fatigue, and a mild headache.  He was tested for Covid earlier this week and that test came back negative.  Has been using cough drops, but the cough continues to be an issue especially at night when he is sleeping.  Denies fevers, chills, sinus pain or pressure   ROS: See pertinent positives and negatives per HPI.  No past medical history on file.  Past Surgical History:  Procedure Laterality Date  . SHOULDER SURGERY Right 2016   torn legament    No family history on file.     Current Outpatient Medications:  .  benzonatate (TESSALON) 200 MG capsule, Take 1 capsule (200 mg total) by mouth 3 (three) times daily as needed for cough., Disp: 30 capsule, Rfl: 1 .  triamcinolone (KENALOG) 0.1 %, Apply 1 application topically 2 (two) times daily., Disp: 30 g, Rfl: 0  EXAM:  VITALS per patient if applicable:  GENERAL: alert, oriented, appears well and in no acute distress  HEENT: atraumatic, conjunttiva clear, no obvious abnormalities on inspection of external nose and ears  NECK: normal movements of the head and neck  LUNGS: on inspection  no signs of respiratory distress, breathing rate appears normal, no obvious gross SOB, gasping or wheezing  CV: no obvious cyanosis  MS: moves all visible extremities without noticeable abnormality  PSYCH/NEURO: pleasant and cooperative, no obvious depression or anxiety, speech and thought processing grossly intact  ASSESSMENT AND PLAN:  Discussed the following assessment and plan:  1. Viral respiratory illness -No concerns for bacterial infection.  Send in [place] to help with his cough.  Advise follow-up if no improvement - benzonatate (TESSALON) 200 MG capsule; Take 1 capsule (200 mg total) by mouth 3 (three) times daily as needed for cough.  Dispense: 30 capsule; Refill: 1     I discussed the assessment and treatment plan with the patient. The patient was provided an opportunity to ask questions and all were answered. The patient agreed with the plan and demonstrated an understanding of the instructions.   The patient was advised to call back or seek an in-person evaluation if the symptoms worsen or if the condition fails to improve as anticipated.   Shirline Frees, NP

## 2020-10-27 ENCOUNTER — Encounter: Payer: Self-pay | Admitting: Adult Health

## 2020-10-28 ENCOUNTER — Encounter: Payer: Self-pay | Admitting: Family Medicine

## 2020-10-28 DIAGNOSIS — B9789 Other viral agents as the cause of diseases classified elsewhere: Secondary | ICD-10-CM

## 2020-10-28 DIAGNOSIS — J988 Other specified respiratory disorders: Secondary | ICD-10-CM

## 2021-02-21 ENCOUNTER — Encounter: Payer: No Typology Code available for payment source | Admitting: Family Medicine

## 2021-04-09 IMAGING — US US SOFT TISSUE
1 series · 14 of 15 positions shown · non-contrast
Comparison: None.

CLINICAL DATA: Palpable abnormality in the region of the left
clavicle likely representing a reactive lymph node following
[id number] vaccination

EXAM:
ULTRASOUND OF HEAD/NECK SOFT TISSUES
TECHNIQUE: Ultrasound examination of the head and neck soft tissues was
performed in the area of clinical concern.

[Series 1: us soft tissue · 0.05mm/px · 14 of 15 slices shown]
[im 1/15]
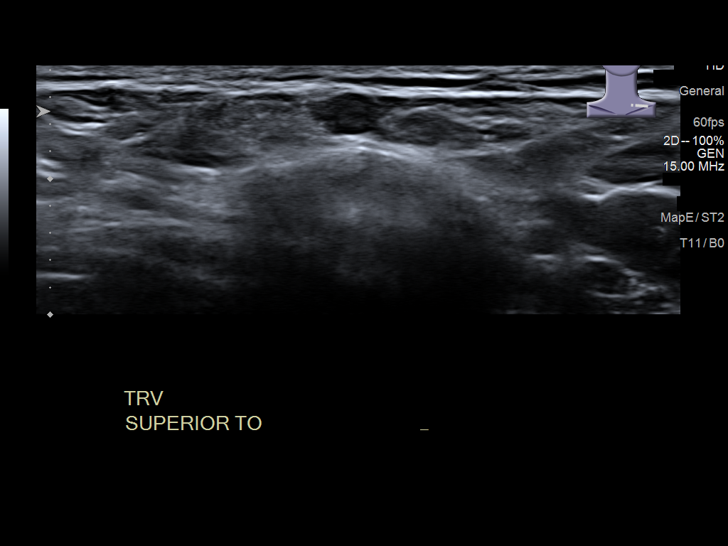
[im 2/15]
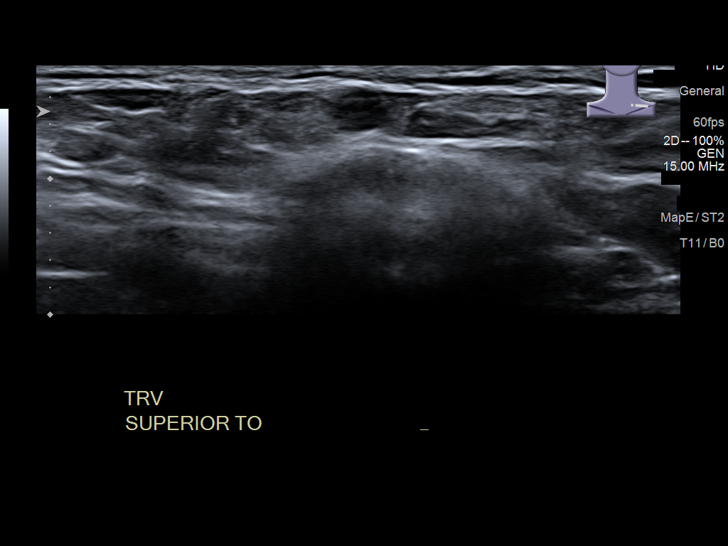
[im 3/15]
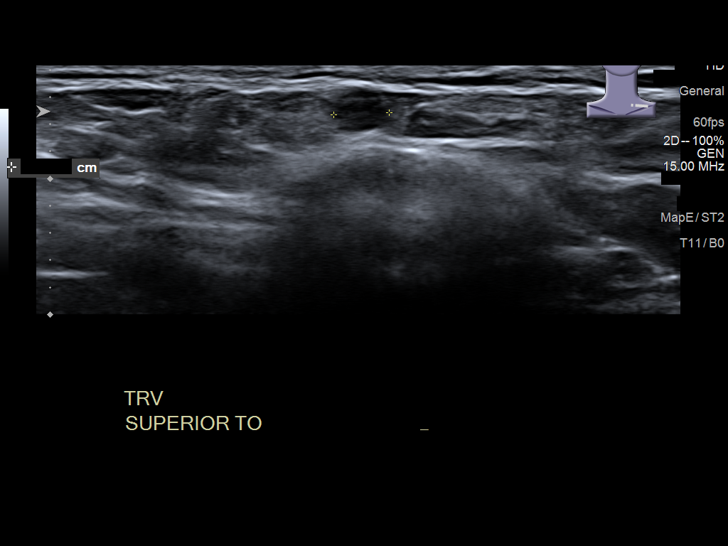
[im 4/15]
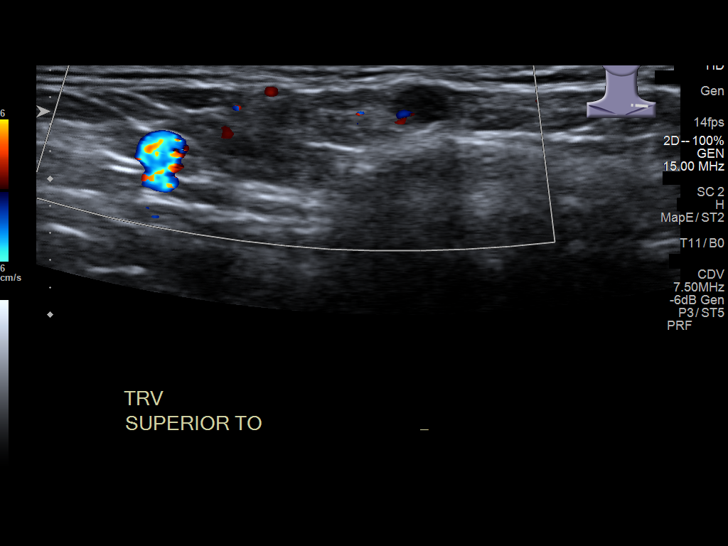
[im 5/15]
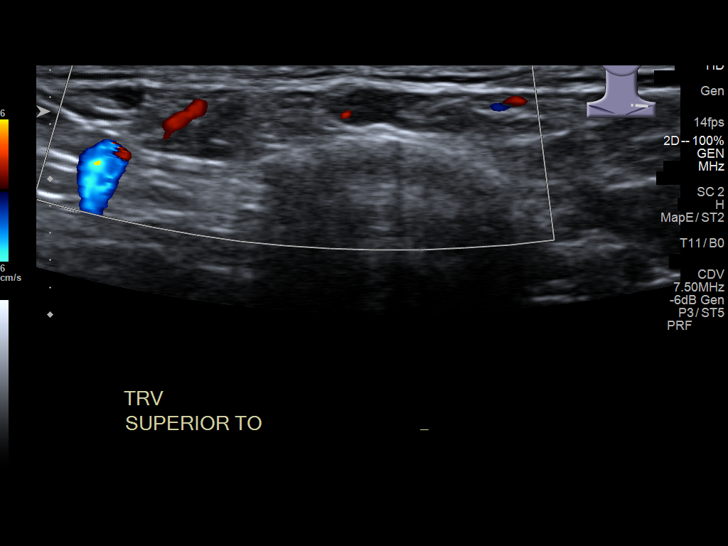
[im 6/15]
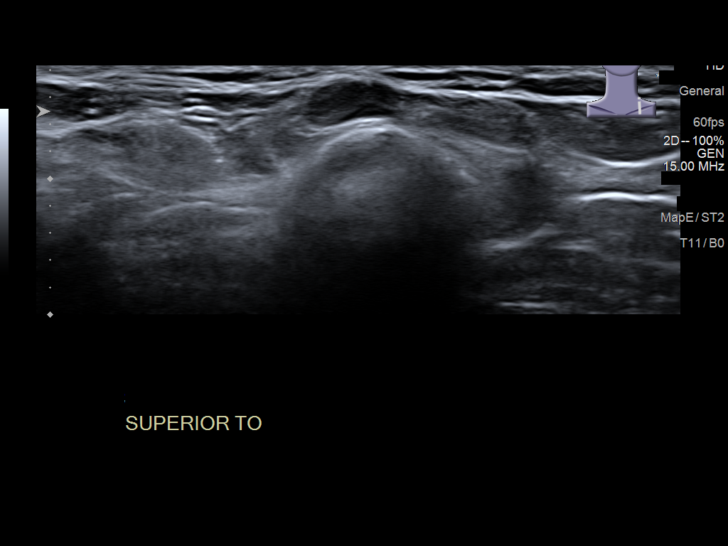
[im 7/15]
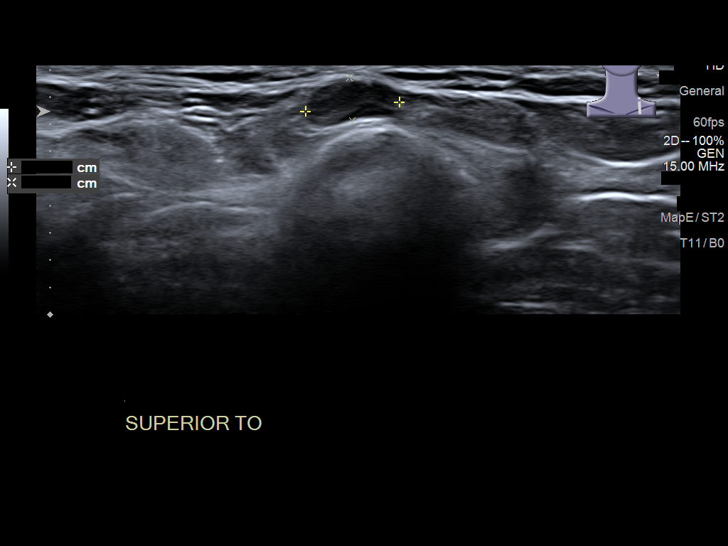
[im 9/15]
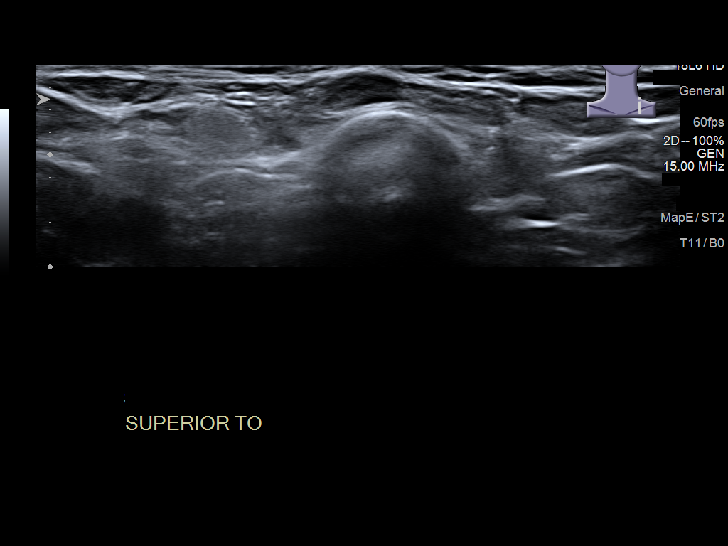
[im 10/15]
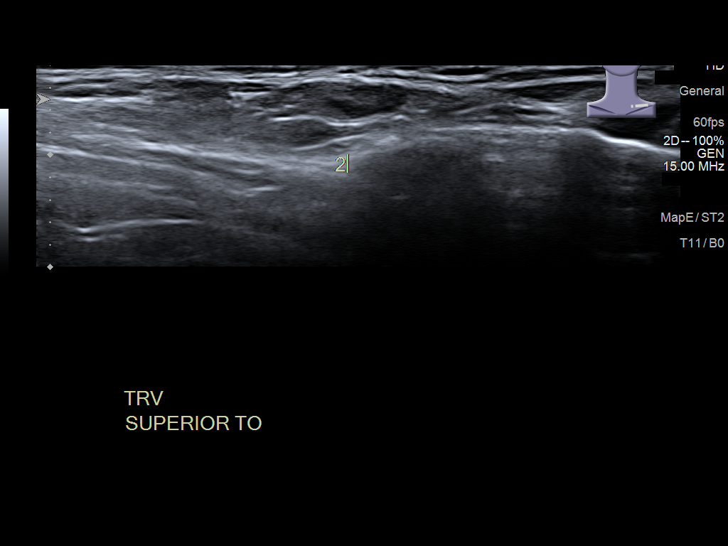
[im 11/15]
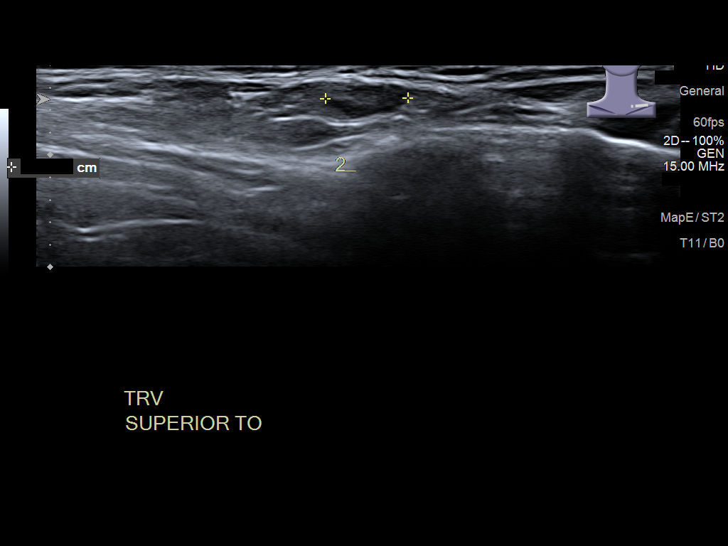
[im 12/15]
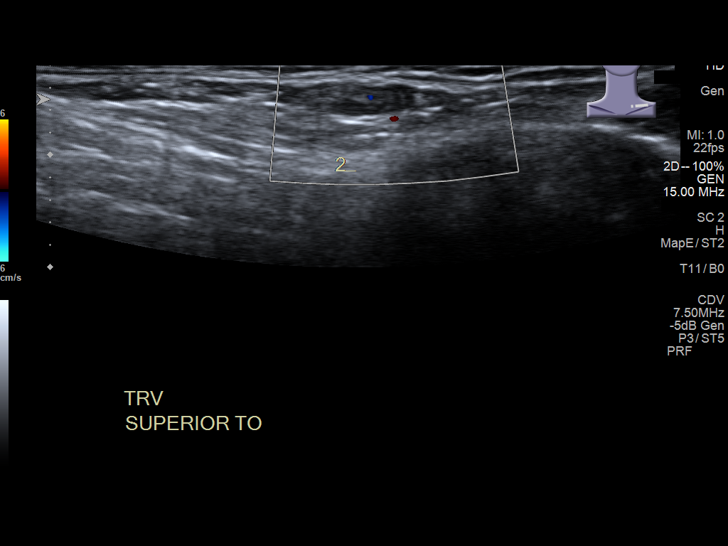
[im 13/15]
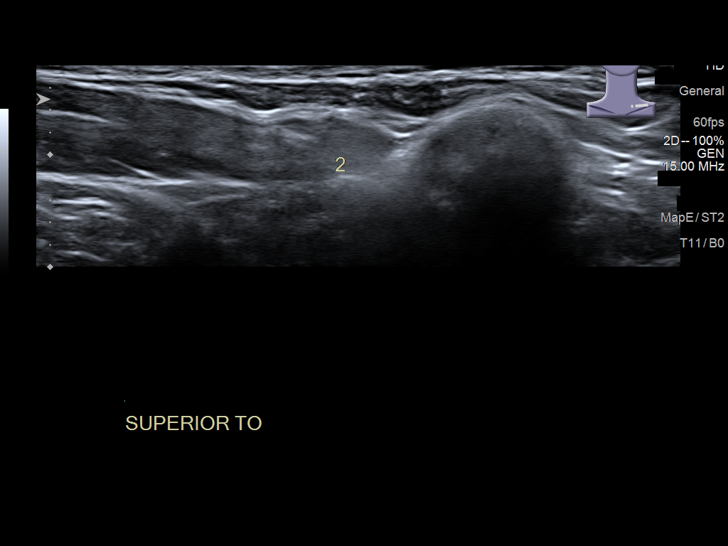
[im 14/15]
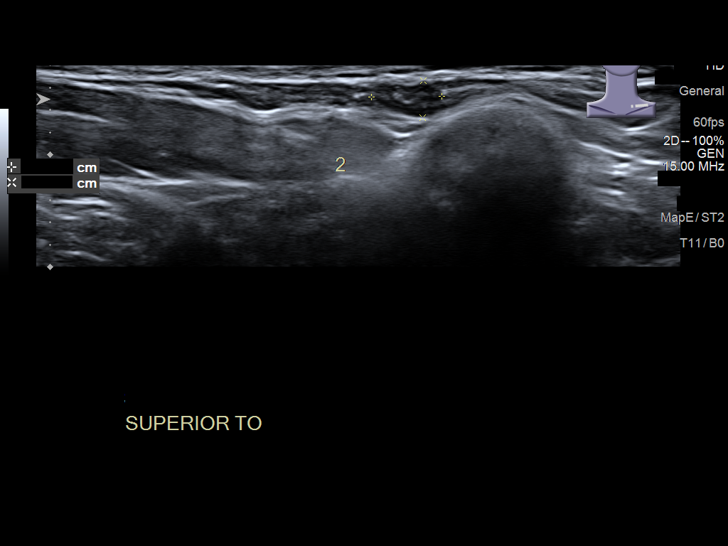
[im 15/15]
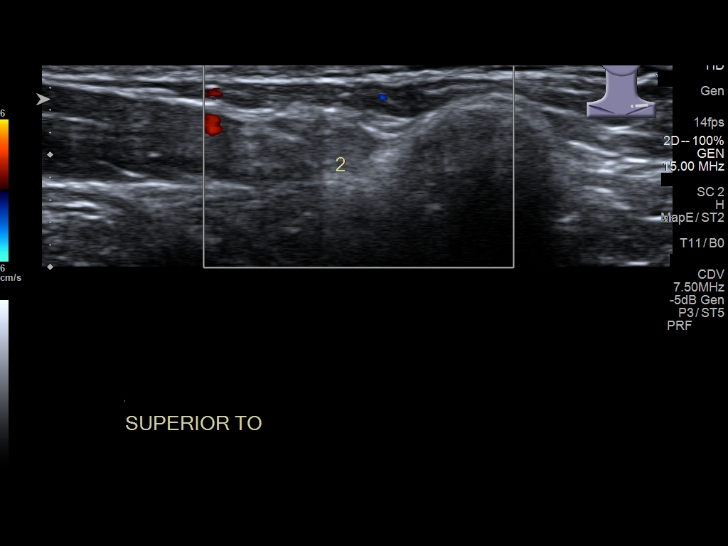

[14 of 15 positions shown; findings below may reference images not displayed]

FINDINGS: Scanning in the area of clinical concern shows small reactive lymph
nodes with normal fatty hila. This corresponds to the patient's
given clinical history of recent [id number] vaccination. These
findings are not unusual in this clinical scenario.
IMPRESSION: Reactive lymph nodes in the left supraclavicular region likely
related to the recent [id number] vaccination. Clinical follow-up is
recommended.

## 2023-02-03 ENCOUNTER — Encounter: Payer: Self-pay | Admitting: Family Medicine

## 2023-02-20 ENCOUNTER — Encounter (INDEPENDENT_AMBULATORY_CARE_PROVIDER_SITE_OTHER): Payer: Self-pay

## 2023-03-11 ENCOUNTER — Ambulatory Visit: Payer: No Typology Code available for payment source | Admitting: Family Medicine

## 2023-03-11 ENCOUNTER — Encounter: Payer: Self-pay | Admitting: Family Medicine

## 2023-03-11 VITALS — BP 120/80 | HR 80 | Temp 98.1°F | Ht 66.5 in | Wt 215.6 lb

## 2023-03-11 DIAGNOSIS — Z6831 Body mass index (BMI) 31.0-31.9, adult: Secondary | ICD-10-CM

## 2023-03-11 DIAGNOSIS — Z13220 Encounter for screening for lipoid disorders: Secondary | ICD-10-CM | POA: Diagnosis not present

## 2023-03-11 DIAGNOSIS — Z23 Encounter for immunization: Secondary | ICD-10-CM | POA: Diagnosis not present

## 2023-03-11 DIAGNOSIS — E6609 Other obesity due to excess calories: Secondary | ICD-10-CM

## 2023-03-11 DIAGNOSIS — Z0000 Encounter for general adult medical examination without abnormal findings: Secondary | ICD-10-CM

## 2023-03-11 NOTE — Progress Notes (Signed)
[name], MD Phone: [phone]  Oscar Walker is a 32 y.o. male who presents today for CPE.  Diet: Up and down, limits soda and sweet tea to the weekends. Exercise: Weights 3 times a week Colonoscopy: not indicated Prostate cancer screening: not indicated Family history-  Prostate cancer: no  Colon cancer: no Vaccines-   Flu: due this fall  Tetanus: due  COVID19: x2 HIV screening: declines Hep C Screening: declines Tobacco use: no Alcohol use: no Illicit Drug use: no Dentist: no Ophthalmology: no   Active Ambulatory Problems    Diagnosis Date Noted   Anxiety 09/25/2016   Hamstring tightness of both lower extremities 09/25/2016   Routine general medical examination at a health care facility 02/22/2019   Lymph node symptom 02/19/2020   Rash 02/19/2020   Resolved Ambulatory Problems    Diagnosis Date Noted   No Resolved Ambulatory Problems   No Additional Past Medical History    History reviewed. No pertinent family history.  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   Social Determinants of Health   Financial Resource Strain: Not on file  Food Insecurity: Not on file  Transportation Needs: Not on file  Physical Activity: Not on file  Stress: Not on file  Social Connections: Not on file  Intimate Partner Violence: Not on file    ROS  General:  Negative for nexplained weight loss,  fever Skin: Negative for new or changing mole, sore that won't heal HEENT: Negative for trouble hearing, trouble seeing, ringing in ears, mouth sores, hoarseness, change in voice, dysphagia. CV:  Negative for chest pain, dyspnea, edema, palpitations Resp: Negative for cough, dyspnea, hemoptysis GI: Negative for nausea, vomiting, diarrhea, constipation, abdominal pain, melena, hematochezia. GU: Negative for dysuria, incontinence, urinary hesitance, hematuria, vaginal or penile discharge, polyuria, sexual difficulty, lumps in testicle or breasts MSK: Negative for muscle cramps or aches, joint pain or swelling Neuro: Negative for headaches, weakness, numbness, dizziness, passing out/fainting Psych: Negative for depression, anxiety, memory problems  Objective  Physical Exam Vitals:   03/11/23 1348 03/11/23 1436  BP: 134/80 124/80    BP Readings from Last 3 Encounters:  03/11/23 124/80  04/04/20 130/70  02/19/20 120/80   Wt Readings from Last 3 Encounters:  03/11/23 215 lb 9.6 oz (97.8 kg)  04/04/20 196 lb 12.8 oz (89.3 kg)  02/19/20 200 lb 3.2 oz (90.8 kg)    Physical Exam Constitutional:      General: He is not in acute distress.    Appearance: He is not diaphoretic.  HENT:     Head: Normocephalic and atraumatic.  Cardiovascular:     Rate and Rhythm: Normal rate and regular rhythm.     Heart sounds: Normal heart sounds.  Pulmonary:     Effort: Pulmonary effort is normal.  Breath sounds: Normal breath sounds.  Abdominal:     General: Bowel sounds are normal. There is no distension.     Palpations: Abdomen is soft.     Tenderness: There is no abdominal tenderness.  Musculoskeletal:        General: No deformity.  Lymphadenopathy:     Cervical: No cervical adenopathy.  Skin:    General: Skin is warm and dry.  Neurological:     Mental Status: He is alert.     Comments: Moves all extremities equally  Psychiatric:        Mood and Affect: Mood normal.       Assessment/Plan:   Routine general medical examination at a health care facility Assessment & Plan: Physical exam completed.  Encouraged healthy diet and exercise.  Discussed trying to limit sugary food intake and fried food intake.  Encouraged to continue with exercise.  Tetanus vaccine given today.  School form filled out.  This will be scanned into the chart.  Labs as outlined.   Lipid screening -     Comprehensive metabolic panel -     Lipid panel  Class 1 obesity due to excess calories without serious comorbidity with body mass index (BMI) of 31.0 to 31.9 in adult -     Hemoglobin A1c  Encounter for administration of vaccine -     Tdap vaccine greater than or equal to 7yo IM    Return in about 6 months (around 09/11/2023) for bp, one year physical.   [name], MD [facility]

## 2023-03-11 NOTE — Assessment & Plan Note (Signed)
Physical exam completed.  Encouraged healthy diet and exercise.  Discussed trying to limit sugary food intake and fried food intake.  Encouraged to continue with exercise.  Tetanus vaccine given today.  School form filled out.  This will be scanned into the chart.  Labs as outlined.

## 2023-03-11 NOTE — Addendum Note (Signed)
Addended by: Prince Solian A on: 03/11/2023 03:37 PM   Modules accepted: Orders

## 2023-03-12 LAB — COMPREHENSIVE METABOLIC PANEL
ALT: 33 U/L (ref 0–53)
AST: 22 U/L (ref 0–37)
Albumin: 4.8 g/dL (ref 3.5–5.2)
Alkaline Phosphatase: 53 U/L (ref 39–117)
BUN: 14 mg/dL (ref 6–23)
CO2: 25 mEq/L (ref 19–32)
Calcium: 10 mg/dL (ref 8.4–10.5)
Chloride: 101 meq/L (ref 96–112)
Creatinine, Ser: 1.06 mg/dL (ref 0.40–1.50)
GFR: 94.54 mL/min (ref >60.00)
Glucose, Bld: 108 mg/dL — ABNORMAL HIGH (ref 70–99)
Potassium: 3.7 meq/L (ref 3.5–5.1)
Sodium: 135 meq/L (ref 135–145)
Total Bilirubin: 0.5 mg/dL (ref 0.2–1.2)
Total Protein: 7.9 g/dL (ref 6.0–8.3)

## 2023-03-12 LAB — LIPID PANEL
Cholesterol: 264 mg/dL — ABNORMAL HIGH (ref 0–200)
HDL: 37.3 mg/dL — ABNORMAL LOW (ref >39.00)
Total CHOL/HDL Ratio: 7
Triglycerides: 460 mg/dL — ABNORMAL HIGH (ref 0.0–149.0)

## 2023-03-12 LAB — HEMOGLOBIN A1C: Hgb A1c MFr Bld: 5.9 % (ref 4.6–6.5)

## 2023-03-12 LAB — LDL CHOLESTEROL, DIRECT: Direct LDL: 203 mg/dL

## 2023-03-13 ENCOUNTER — Telehealth: Payer: Self-pay | Admitting: Family Medicine

## 2023-03-13 ENCOUNTER — Telehealth: Payer: Self-pay

## 2023-03-13 DIAGNOSIS — E785 Hyperlipidemia, unspecified: Secondary | ICD-10-CM

## 2023-03-13 NOTE — Telephone Encounter (Signed)
Ordered

## 2023-03-13 NOTE — Telephone Encounter (Signed)
Patient need lab orders.

## 2023-03-13 NOTE — Telephone Encounter (Signed)
 Left message to call the office back regarding the lab results below.

## 2023-03-13 NOTE — Telephone Encounter (Signed)
Patient states he is returning our call.  I read message from Dr. [name] to patient.  Patient states he was not fasting for his recent lab appointment.  I scheduled a new fasting lab visit for patient as well as the follow-up with Dr. Birdie Sons in three months.

## 2023-03-13 NOTE — Telephone Encounter (Signed)
-----   Message from [name] sent at 03/12/2023 12:01 PM EDT ----- Please let the patient know his triglycerides were elevated.  His LDL is quite elevated as well.  Was he fasting?  If he was fasting then we need to consider starting him on cholesterol medication to help reduce his cholesterol.  If he was not fasting I would like to recheck his lipid panel fasting in the next week or 2.  His A1c is in the prediabetic range.  He needs to work on diet and exercise for that.  He should have follow-up with me in 3 months for these things.

## 2023-03-18 ENCOUNTER — Telehealth: Payer: Self-pay

## 2023-03-18 NOTE — Telephone Encounter (Signed)
Left voicemail for pt to give office a call back my chart message sent

## 2023-03-18 NOTE — Telephone Encounter (Signed)
-----   Message from [name] sent at 03/12/2023 12:01 PM EDT ----- Please let the patient know his triglycerides were elevated.  His LDL is quite elevated as well.  Was he fasting?  If he was fasting then we need to consider starting him on cholesterol medication to help reduce his cholesterol.  If he was not fasting I would like to recheck his lipid panel fasting in the next week or 2.  His A1c is in the prediabetic range.  He needs to work on diet and exercise for that.  He should have follow-up with me in 3 months for these things.

## 2023-03-19 ENCOUNTER — Other Ambulatory Visit (INDEPENDENT_AMBULATORY_CARE_PROVIDER_SITE_OTHER): Payer: No Typology Code available for payment source

## 2023-03-19 DIAGNOSIS — E785 Hyperlipidemia, unspecified: Secondary | ICD-10-CM | POA: Diagnosis not present

## 2023-03-19 NOTE — Telephone Encounter (Signed)
Noted  

## 2023-03-19 NOTE — Telephone Encounter (Signed)
Pt returned Oscar Walker call. Note below was read to him. He was aware of message. Pt stated that he will come in today to repeat labs and already has an appt set up in 3 months to see provider.

## 2023-03-20 ENCOUNTER — Telehealth: Payer: Self-pay

## 2023-03-20 LAB — LIPID PANEL
Cholesterol: 222 mg/dL — ABNORMAL HIGH (ref 0–200)
HDL: 30.2 mg/dL — ABNORMAL LOW (ref >39.00)
NonHDL: 192.06
Total CHOL/HDL Ratio: 7
Triglycerides: 201 mg/dL — ABNORMAL HIGH (ref 0.0–149.0)
VLDL: 40.2 mg/dL — ABNORMAL HIGH (ref 0.0–40.0)

## 2023-03-20 LAB — LDL CHOLESTEROL, DIRECT: Direct LDL: 195 mg/dL

## 2023-03-20 NOTE — Telephone Encounter (Signed)
-----   Message from [name] sent at 03/20/2023 11:28 AM EDT ----- Patient's cholesterol improved some though his LDL is still elevated significantly.  It is at a level where I would recommend starting a statin such as Crestor 20 mg daily and rechecking labs in 6 weeks.  Generally when the LDL cholesterol is at this level there is a genetic component and medication is required to get the LDL cholesterol down to help reduce risk of stroke and heart attack.  I can send the Crestor and if he is willing.

## 2023-03-20 NOTE — Telephone Encounter (Signed)
Left message to call the office regarding lab results below.

## 2023-03-20 NOTE — Telephone Encounter (Signed)
Noted  

## 2023-03-20 NOTE — Telephone Encounter (Signed)
Patient just called back. I read him the message. He said he would not like to have any medication at this time. He said he has another appointment in 3 months and he will try to get it down by than.

## 2023-03-21 ENCOUNTER — Telehealth: Payer: Self-pay

## 2023-03-21 NOTE — Telephone Encounter (Signed)
 Left message to call the office back regarding the lab results below.

## 2023-03-21 NOTE — Telephone Encounter (Signed)
-----   Message from [name] sent at 03/20/2023 11:28 AM EDT ----- Patient's cholesterol improved some though his LDL is still elevated significantly.  It is at a level where I would recommend starting a statin such as Crestor 20 mg daily and rechecking labs in 6 weeks.  Generally when the LDL cholesterol is at this level there is a genetic component and medication is required to get the LDL cholesterol down to help reduce risk of stroke and heart attack.  I can send the Crestor and if he is willing.

## 2023-03-28 ENCOUNTER — Telehealth: Payer: Self-pay

## 2023-03-28 NOTE — Telephone Encounter (Signed)
-----   Message from [name] sent at 03/20/2023 11:28 AM EDT ----- Patient's cholesterol improved some though his LDL is still elevated significantly.  It is at a level where I would recommend starting a statin such as Crestor 20 mg daily and rechecking labs in 6 weeks.  Generally when the LDL cholesterol is at this level there is a genetic component and medication is required to get the LDL cholesterol down to help reduce risk of stroke and heart attack.  I can send the Crestor and if he is willing.

## 2023-03-28 NOTE — Telephone Encounter (Signed)
Lvm for pt to call office in regards to lab results, mychart message being sent.

## 2023-03-29 ENCOUNTER — Telehealth: Payer: Self-pay

## 2023-03-29 NOTE — Telephone Encounter (Signed)
Lvm for pt to give office a callback. Letter being sent out

## 2023-03-29 NOTE — Telephone Encounter (Signed)
----- 

## 2023-06-12 ENCOUNTER — Ambulatory Visit: Payer: No Typology Code available for payment source | Admitting: Family Medicine

## 2023-09-11 ENCOUNTER — Ambulatory Visit: Payer: No Typology Code available for payment source | Admitting: Family Medicine

## 2023-10-03 ENCOUNTER — Telehealth: Payer: Self-pay | Admitting: Family Medicine

## 2023-10-03 NOTE — Telephone Encounter (Signed)
 Dr Birdie Sons is no longer at this location. Please call the office to schedule a Transfer of Care to either Dr Charlann Lange, Darleen Crocker or Kara Dies, NP

## 2024-03-16 ENCOUNTER — Encounter: Payer: No Typology Code available for payment source | Admitting: Family Medicine

## 2024-03-16 ENCOUNTER — Ambulatory Visit: Payer: No Typology Code available for payment source | Admitting: Family Medicine

## 2024-09-11 ENCOUNTER — Ambulatory Visit: Payer: No Typology Code available for payment source | Admitting: Family Medicine
